# Patient Record
Sex: Male | Born: 1970 | Race: White | Hispanic: No | Marital: Single | Smoking: Current every day smoker
Health system: Southern US, Community
[De-identification: ages and names within clinical notes are randomized; demographics above are authoritative.]

---

## 2012-08-22 ENCOUNTER — Emergency Department (HOSPITAL_BASED_OUTPATIENT_CLINIC_OR_DEPARTMENT_OTHER)
Admission: EM | Admit: 2012-08-22 | Discharge: 2012-08-22 | Disposition: A | Discharge status: Home / Self Care | Payer: No Typology Code available for payment source | Attending: Emergency Medicine | Admitting: Emergency Medicine

## 2012-08-22 ENCOUNTER — Encounter (HOSPITAL_BASED_OUTPATIENT_CLINIC_OR_DEPARTMENT_OTHER): Payer: Self-pay

## 2012-08-22 DIAGNOSIS — IMO0001 Reserved for inherently not codable concepts without codable children: Secondary | ICD-10-CM | POA: Insufficient documentation

## 2012-08-22 DIAGNOSIS — R197 Diarrhea, unspecified: Secondary | ICD-10-CM | POA: Insufficient documentation

## 2012-08-22 DIAGNOSIS — R05 Cough: Secondary | ICD-10-CM | POA: Insufficient documentation

## 2012-08-22 DIAGNOSIS — B9789 Other viral agents as the cause of diseases classified elsewhere: Secondary | ICD-10-CM | POA: Insufficient documentation

## 2012-08-22 DIAGNOSIS — R1110 Vomiting, unspecified: Secondary | ICD-10-CM | POA: Insufficient documentation

## 2012-08-22 DIAGNOSIS — R059 Cough, unspecified: Secondary | ICD-10-CM | POA: Insufficient documentation

## 2012-08-22 DIAGNOSIS — F17200 Nicotine dependence, unspecified, uncomplicated: Secondary | ICD-10-CM | POA: Insufficient documentation

## 2012-08-22 DIAGNOSIS — B349 Viral infection, unspecified: Secondary | ICD-10-CM

## 2012-08-22 MED ORDER — DEXTROMETHORPHAN HBR 15 MG/5ML PO SYRP: 10.0000 mL | ORAL_SOLUTION | Freq: Four times a day (QID) | ORAL | Status: DC | PRN

## 2012-08-22 MED ORDER — OSELTAMIVIR PHOSPHATE 75 MG PO CAPS: 75.0000 mg | ORAL_CAPSULE | Freq: Two times a day (BID) | ORAL | Status: DC

## 2012-08-22 NOTE — ED Provider Notes (Signed)
History     CSN: [id number]  Arrival date & time 08/22/12  1206   First MD Initiated Contact with Patient 08/22/12 1219      Chief Complaint  Patient presents with  . Fever  . Generalized Body Aches  . Cough  . Diarrhea    (Consider location/radiation/quality/duration/timing/severity/associated sxs/prior treatment) HPI Comments: Pt presents today with fever, generalized body aches, headache, cough, and diarrhea x 1 day.  Also reports one episode of vomiting. Symptoms were of sudden onset and have been progressively worsening.  Highest reported fever was 101.  States he was recently in contact with girlfriends son who was diagnosed and treated for the flu. Has taken OTC Ibuprofen for fever and headache with some relief.  Denies any SOB, chest pain, or abdominal pain.  Patient is a 42 y.o. male presenting with fever, cough, and diarrhea.  Fever Primary symptoms of the febrile illness include fever, fatigue, headaches, cough, vomiting, diarrhea and arthralgias.  Cough Associated symptoms include headaches.  Diarrhea The primary symptoms include fever, fatigue, vomiting, diarrhea and arthralgias.    History reviewed. No pertinent past medical history.  History reviewed. No pertinent past surgical history.  No family history on file.  History  Substance Use Topics  . Smoking status: Current Every Day Smoker -- 1.0 packs/day    Types: Cigarettes  . Smokeless tobacco: Not on file  . Alcohol Use: No      Review of Systems  Constitutional: Positive for fever and fatigue.  Respiratory: Positive for cough.   Gastrointestinal: Positive for vomiting and diarrhea.  Musculoskeletal: Positive for arthralgias.  Neurological: Positive for headaches.  All other systems reviewed and are negative.    Allergies  Review of patient's allergies indicates no known allergies.  Home Medications  No current outpatient prescriptions on file.  BP 133/69  Pulse 100  Temp 98.7 F (37.1  C) (Oral)  Resp 18  Ht 5\' 11"  (1.803 m)  Wt 210 lb (95.255 kg)  BMI 29.29 kg/m2  SpO2 100%  Physical Exam  Nursing note and vitals reviewed. Constitutional: He is oriented to person, place, and time. He appears well-developed and well-nourished. No distress.  HENT:  Mouth/Throat: Oropharynx is clear and moist. No oropharyngeal exudate.  Eyes: EOM are normal.  Neck: Normal range of motion.  Cardiovascular: Normal rate, regular rhythm and normal heart sounds.   Pulmonary/Chest: Effort normal and breath sounds normal. No respiratory distress. He has no wheezes. He has no rales. He exhibits no tenderness.  Abdominal: Soft. Bowel sounds are normal. There is no tenderness.  Musculoskeletal: Normal range of motion.  Neurological: He is alert and oriented to person, place, and time.  Skin: Skin is warm and dry. He is not diaphoretic.  Psychiatric: He has a normal mood and affect. His behavior is normal.    ED Course  Procedures (including critical care time)  Labs Reviewed - No data to display No results found.   1. Viral illness       MDM  12:54 PM Patient likely has a viral illness. Patient afebrile and non toxic appearing. Patient will be treated with tamiflu and dextromethorphan. Vitals stable for discharge. Patient instructed to return with worsening or concerning symptoms.       Emilia Beck, PA-C 08/22/12 1604

## 2012-08-22 NOTE — ED Notes (Signed)
Pt reports generalized body aches, fever, cough and diarrhea since yesterday.

## 2012-08-23 NOTE — ED Provider Notes (Signed)
Medical screening examination/treatment/procedure(s) were performed by non-physician practitioner and as supervising physician I was immediately available for consultation/collaboration.  Geoffery Lyons, MD 08/23/12 [phone]

## 2014-03-21 ENCOUNTER — Emergency Department (HOSPITAL_BASED_OUTPATIENT_CLINIC_OR_DEPARTMENT_OTHER): Payer: No Typology Code available for payment source

## 2014-03-21 ENCOUNTER — Encounter (HOSPITAL_BASED_OUTPATIENT_CLINIC_OR_DEPARTMENT_OTHER): Payer: Self-pay | Admitting: Emergency Medicine

## 2014-03-21 ENCOUNTER — Emergency Department (HOSPITAL_BASED_OUTPATIENT_CLINIC_OR_DEPARTMENT_OTHER)
Admission: EM | Admit: 2014-03-21 | Discharge: 2014-03-21 | Discharge status: Against Medical Advice | Payer: No Typology Code available for payment source | Attending: Emergency Medicine | Admitting: Emergency Medicine

## 2014-03-21 DIAGNOSIS — IMO0002 Reserved for concepts with insufficient information to code with codable children: Secondary | ICD-10-CM | POA: Insufficient documentation

## 2014-03-21 DIAGNOSIS — R079 Chest pain, unspecified: Secondary | ICD-10-CM | POA: Insufficient documentation

## 2014-03-21 DIAGNOSIS — X58XXXA Exposure to other specified factors, initial encounter: Secondary | ICD-10-CM | POA: Diagnosis not present

## 2014-03-21 DIAGNOSIS — R0789 Other chest pain: Secondary | ICD-10-CM | POA: Insufficient documentation

## 2014-03-21 DIAGNOSIS — R55 Syncope and collapse: Secondary | ICD-10-CM | POA: Insufficient documentation

## 2014-03-21 DIAGNOSIS — Y9301 Activity, walking, marching and hiking: Secondary | ICD-10-CM | POA: Diagnosis not present

## 2014-03-21 DIAGNOSIS — Y9289 Other specified places as the place of occurrence of the external cause: Secondary | ICD-10-CM | POA: Diagnosis not present

## 2014-03-21 DIAGNOSIS — Z79899 Other long term (current) drug therapy: Secondary | ICD-10-CM | POA: Diagnosis not present

## 2014-03-21 DIAGNOSIS — Z720 Tobacco use: Secondary | ICD-10-CM

## 2014-03-21 DIAGNOSIS — F17200 Nicotine dependence, unspecified, uncomplicated: Secondary | ICD-10-CM | POA: Insufficient documentation

## 2014-03-21 LAB — CBC WITH DIFFERENTIAL/PLATELET
BASOS PCT: 0 % (ref 0–1)
Basophils Absolute: 0.1 10*3/uL (ref 0.0–0.1)
EOS ABS: 0.3 10*3/uL (ref 0.0–0.7)
EOS PCT: 2 % (ref 0–5)
HEMATOCRIT: 44.3 % (ref 39.0–52.0)
HEMOGLOBIN: 15.5 g/dL (ref 13.0–17.0)
Lymphocytes Relative: 17 % (ref 12–46)
Lymphs Abs: 2.6 10*3/uL (ref 0.7–4.0)
MCH: 31.8 pg (ref 26.0–34.0)
MCHC: 35 g/dL (ref 30.0–36.0)
MCV: 90.8 fL (ref 78.0–100.0)
MONO ABS: 0.9 10*3/uL (ref 0.1–1.0)
MONOS PCT: 6 % (ref 3–12)
Neutro Abs: 11 10*3/uL — ABNORMAL HIGH (ref 1.7–7.7)
Neutrophils Relative %: 75 % (ref 43–77)
Platelets: 231 10*3/uL (ref 150–400)
RBC: 4.88 MIL/uL (ref 4.22–5.81)
RDW: 13.9 % (ref 11.5–15.5)
WBC: 14.9 10*3/uL — ABNORMAL HIGH (ref 4.0–10.5)

## 2014-03-21 LAB — BASIC METABOLIC PANEL
Anion gap: 14 (ref 5–15)
BUN: 15 mg/dL (ref 6–23)
CO2: 23 mEq/L (ref 19–32)
CREATININE: 0.9 mg/dL (ref 0.50–1.35)
Calcium: 9.5 mg/dL (ref 8.4–10.5)
Chloride: 104 mEq/L (ref 96–112)
Glucose, Bld: 116 mg/dL — ABNORMAL HIGH (ref 70–99)
Potassium: 3.7 mEq/L (ref 3.7–5.3)
Sodium: 141 mEq/L (ref 137–147)

## 2014-03-21 LAB — D-DIMER, QUANTITATIVE: D-Dimer, Quant: 0.8 ug/mL-FEU — ABNORMAL HIGH (ref 0.00–0.48)

## 2014-03-21 LAB — TROPONIN I: Troponin I: <0.3 ng/mL (ref <0.30)

## 2014-03-21 MED ORDER — IOHEXOL 350 MG/ML SOLN
80.0000 mL | Freq: Once | INTRAVENOUS | Status: AC | PRN
  Administered 2014-03-21: 80 mL via INTRAVENOUS

## 2014-03-21 MED ORDER — ASPIRIN 81 MG PO CHEW
324.0000 mg | CHEWABLE_TABLET | Freq: Once | ORAL | Status: AC
  Administered 2014-03-21: 324 mg via ORAL
  Filled 2014-03-21: qty 4

## 2014-03-21 NOTE — ED Notes (Signed)
Pt states that  pm last night he developed chest pressure with nausea, dizziness, with a near syncopal episode. Pt reports pain has continued.

## 2014-03-21 NOTE — ED Notes (Signed)
MD at bedside. 

## 2014-03-21 NOTE — ED Notes (Signed)
Patient transported to CT 

## 2014-03-21 NOTE — ED Notes (Signed)
Pt left ama, instructed by rn and MD about the events that could occur, pt refused to stay for additional testing and requested to sign out ama

## 2014-03-21 NOTE — ED Provider Notes (Signed)
CSN: [id number]     Arrival date & time 03/21/14  0359 History   First MD Initiated Contact with Patient 03/21/14 0407     Chief Complaint  Patient presents with  . Chest Pain  . Near Syncope     (Consider location/radiation/quality/duration/timing/severity/associated sxs/prior Treatment) HPI 43 yo male presents to the ER from home with complaint of syncope and chest pain.  Pt reports around 10 pm he was leaving a friends house when he began to feel queasy, hot, flushed, dizzy, and lightheaded.  He got out of the car to walk around and blacked out.  He reports he felt himself falling and caught himself with his elbows and knees.  He does not think he went all the way out.  Pt went to bed upon getting home.  Pt woke around 330 am with right sided chest pain, dull in nature.  No sob, diaphoresis.  Pain described as an ache, muscle pull but can't locate the pain.  Pt is a 1 ppd smoker.  No h/o cardiac disease, htn, hyperlipidemia or dm, but does not see a doctor regularly.  No family history of CAD. History reviewed. No pertinent past medical history. History reviewed. No pertinent past surgical history. History reviewed. No pertinent family history. History  Substance Use Topics  . Smoking status: Current Every Day Smoker -- 1.00 packs/day    Types: Cigarettes  . Smokeless tobacco: Not on file  . Alcohol Use: No    Review of Systems  See History of Present Illness; otherwise all other systems are reviewed and negative  Allergies  Review of patient's allergies indicates no known allergies.  Home Medications   Prior to Admission medications   Medication Sig Start Date End Date Taking? Authorizing Provider  dextromethorphan 15 MG/5ML syrup Take 10 mLs (30 mg total) by mouth 4 (four) times daily as needed for cough. 08/22/12   Emilia Beck, PA-C  oseltamivir (TAMIFLU) 75 MG capsule Take 1 capsule (75 mg total) by mouth every 12 (twelve) hours. 08/22/12   Kaitlyn Szekalski, PA-C   BP  137/78  Pulse 82  Temp(Src) 98.3 F (36.8 C) (Oral)  Resp 20  Wt 225 lb (102.059 kg)  SpO2 98% Physical Exam  Nursing note and vitals reviewed. Constitutional: He is oriented to person, place, and time. He appears well-developed and well-nourished.  HENT:  Head: Normocephalic and atraumatic.  Nose: Nose normal.  Mouth/Throat: Oropharynx is clear and moist.  Eyes: Conjunctivae and EOM are normal. Pupils are equal, round, and reactive to light.  Neck: Normal range of motion. Neck supple. No JVD present. No tracheal deviation present. No thyromegaly present.  Cardiovascular: Normal rate, regular rhythm, normal heart sounds and intact distal pulses.  Exam reveals no gallop and no friction rub.   No murmur heard. Pulmonary/Chest: Effort normal and breath sounds normal. No stridor. No respiratory distress. He has no wheezes. He has no rales. He exhibits tenderness (patient is tenderness palpation to right anterior chest whichseems to reproduce pain somewhat).  Abdominal: Soft. Bowel sounds are normal. He exhibits no distension and no mass. There is no tenderness. There is no rebound and no guarding.  Musculoskeletal: Normal range of motion. He exhibits no edema and no tenderness.  Abrasion to right elbow  Lymphadenopathy:    He has no cervical adenopathy.  Neurological: He is alert and oriented to person, place, and time. He exhibits normal muscle tone. Coordination normal.  Skin: Skin is warm and dry. No rash noted. No erythema. No  pallor.  Psychiatric: He has a normal mood and affect. His behavior is normal. Judgment and thought content normal.    ED Course  Procedures (including critical care time) Labs Review Labs Reviewed  CBC WITH DIFFERENTIAL - Abnormal; Notable for the following:    WBC 14.9 (*)    Neutro Abs 11.0 (*)    All other components within normal limits  BASIC METABOLIC PANEL - Abnormal; Notable for the following:    Glucose, Bld 116 (*)    All other components  within normal limits  D-DIMER, QUANTITATIVE - Abnormal; Notable for the following:    D-Dimer, Quant 0.80 (*)    All other components within normal limits  TROPONIN I    Imaging Review Dg Chest 2 View  03/21/2014   CLINICAL DATA:  Chest pain and pressure.  Dizziness.  Smoker.  EXAM: CHEST  2 VIEW  COMPARISON:  None.  FINDINGS: Normal heart size and pulmonary vascularity. Peribronchial thickening and central interstitial changes suggesting chronic bronchitis. No focal airspace disease or consolidation in the lungs. No blunting of costophrenic angles. No pneumothorax.  IMPRESSION: Chronic bronchitic changes in the lungs. No evidence of active disease.   Electronically Signed   By: Burman Nieves M.D.   On: 03/21/2014 04:34   Ct Angio Chest Pe W/cm &/or Wo Cm  03/21/2014   CLINICAL DATA:  Chest pressure with nausea, dizziness, near syncope. Elevated D-dimer.  EXAM: CT ANGIOGRAPHY CHEST WITH CONTRAST  TECHNIQUE: Multidetector CT imaging of the chest was performed using the standard protocol during bolus administration of intravenous contrast. Multiplanar CT image reconstructions and MIPs were obtained to evaluate the vascular anatomy.  CONTRAST:  80mL OMNIPAQUE IOHEXOL 350 MG/ML SOLN  COMPARISON:  Prior radiograph performed earlier on the same day.  FINDINGS: Thyroid gland within normal limits.  There is a mildly enlarged 1.3 cm right hilar lymph node (series 5, image 13). No other pathologically enlarged mediastinal, hilar, or axillary lymph nodes are identified.  Intrathoracic aorta is of normal caliber and appearance. Great vessels within normal limits.  Heart size is normal.  No pericardial effusion.  Pulmonary arterial tree is well opacified. No filling defect to suggest acute pulmonary embolism. Re-formatted imaging confirms these findings.  The lungs are clear without focal infiltrate or pulmonary edema. No pleural effusion. No pneumothorax. Mild air trapping seen at the lung bases. Single 3 mm  subpleural nodule seen within the left upper lobe (series 6, image 21). No other pulmonary nodule or mass.  Visualized portions of the upper abdomen are within normal limits.  No acute osseous abnormality. No worrisome lytic or blastic osseous lesions.  Review of the MIP images confirms the above findings.  IMPRESSION: 1. No CT evidence of acute pulmonary embolism. 2. No other acute cardiopulmonary process identified. 3. Mildly prominent 1.3 cm right hilar lymph node, of uncertain clinical significance. 4. 3 mm left upper lobe pulmonary nodule. If the patient is at high risk for bronchogenic carcinoma, follow-up chest CT at 1 year is recommended. If the patient is at low risk, no follow-up is needed. This recommendation follows the consensus statement: Guidelines for Management of Small Pulmonary Nodules Detected on CT Scans: A Statement from the Fleischner Society as published in Radiology 2005; 237:395-400.   Electronically Signed   By: Rise Mu M.D.   On: 03/21/2014 05:39     EKG Interpretation   Date/Time:  Friday March 21 2014 04:07:14 EDT Ventricular Rate:  80 PR Interval:  156 QRS Duration: 84 QT  Interval:  388 QTC Calculation: 447 R Axis:   37 Text Interpretation:  Normal sinus rhythm Normal ECG No old tracing to  compare Confirmed by Nasim Garofano  MD, Darren Caldron ([id number]) on 03/21/2014 4:36:23 AM      MDM   Final diagnoses:  Chest pain, unspecified chest pain type  Syncope, unspecified syncope type  Tobacco abuse   43 year old male with near syncope presyncope or this evening which sound somewhat like a vasovagal reaction now with chest pain.  EKG normal sinus rhythm.  D-dimer is elevated, discuss with patient need for CT Angio Chest.  Will also get delta troponin, and if negative will discharge home.  Patient has been strongly advised to work on cutting back or quitting his smoking.   6:03 AM Pt does not wish to stay for delta troponin.  Will let sign out AMA.  Pt has been advised  of his workup thus far, and need for f/u in 1 year for nodule noted on CT chest.  Olivia Mackie, MD 03/21/14 [phone]

## 2014-08-15 ENCOUNTER — Ambulatory Visit: Payer: Self-pay | Admitting: Medical

## 2014-12-30 ENCOUNTER — Encounter (HOSPITAL_BASED_OUTPATIENT_CLINIC_OR_DEPARTMENT_OTHER): Payer: Self-pay | Admitting: *Deleted

## 2014-12-30 ENCOUNTER — Emergency Department (HOSPITAL_BASED_OUTPATIENT_CLINIC_OR_DEPARTMENT_OTHER)
Admission: EM | Admit: 2014-12-30 | Discharge: 2014-12-30 | Disposition: A | Discharge status: Home / Self Care | Payer: 59 | Attending: Emergency Medicine | Admitting: Emergency Medicine

## 2014-12-30 ENCOUNTER — Emergency Department (HOSPITAL_BASED_OUTPATIENT_CLINIC_OR_DEPARTMENT_OTHER): Payer: 59

## 2014-12-30 DIAGNOSIS — M25522 Pain in left elbow: Secondary | ICD-10-CM | POA: Diagnosis present

## 2014-12-30 DIAGNOSIS — G5632 Lesion of radial nerve, left upper limb: Secondary | ICD-10-CM

## 2014-12-30 DIAGNOSIS — Z720 Tobacco use: Secondary | ICD-10-CM | POA: Insufficient documentation

## 2014-12-30 NOTE — Discharge Instructions (Signed)

## 2014-12-30 NOTE — ED Notes (Signed)
Left elbow pain after carrying water.

## 2014-12-30 NOTE — ED Provider Notes (Addendum)
CSN: [id number]     Arrival date & time 12/30/14  1527 History   First MD Initiated Contact with Patient 12/30/14 1534     Chief Complaint  Patient presents with  . Arm Pain     (Consider location/radiation/quality/duration/timing/severity/associated sxs/prior Treatment) Patient is a 44 y.o. male presenting with arm pain. The history is provided by the patient.  Arm Pain This is a new (carrying 10-15lb case of water when elbow started hurting) problem. The current episode started less than 1 hour ago. The problem occurs constantly. The problem has not changed since onset.Associated symptoms comments: Pain in the left elbow worse with moving of the forearm. The symptoms are aggravated by bending and twisting. The symptoms are relieved by rest. He has tried nothing for the symptoms. The treatment provided no relief.    History reviewed. No pertinent past medical history. History reviewed. No pertinent past surgical history. No family history on file. History  Substance Use Topics  . Smoking status: Current Every Day Smoker -- 1.00 packs/day    Types: Cigarettes  . Smokeless tobacco: Not on file  . Alcohol Use: No    Review of Systems  All other systems reviewed and are negative.     Allergies  Review of patient's allergies indicates no known allergies.  Home Medications   Prior to Admission medications   Medication Sig Start Date End Date Taking? Authorizing Provider  dextromethorphan 15 MG/5ML syrup Take 10 mLs (30 mg total) by mouth 4 (four) times daily as needed for cough. 08/22/12   Emilia Beck, PA-C  oseltamivir (TAMIFLU) 75 MG capsule Take 1 capsule (75 mg total) by mouth every 12 (twelve) hours. 08/22/12   Kaitlyn Szekalski, PA-C   BP 139/82 mmHg  Pulse 74  Temp(Src) 98.4 F (36.9 C) (Oral)  Resp 16  Ht  (1.803 m)  Wt 210 lb (95.255 kg)  BMI 29.30 kg/m2  SpO2 100% Physical Exam  Constitutional: He is oriented to person, place, and time. He appears  well-developed and well-nourished. No distress.  HENT:  Head: Normocephalic and atraumatic.  Cardiovascular: Normal rate.   Pulmonary/Chest: Effort normal.  Musculoskeletal:       Left elbow: He exhibits normal range of motion, no swelling, no deformity and no laceration. Tenderness found. Radial head tenderness noted.  Tenderness over the left radial head and over the radial groove.  Reproduction of sx with hand/forearm supination.  Neurological: He is alert and oriented to person, place, and time.  Nursing note and vitals reviewed.   ED Course  Procedures (including critical care time) Labs Review Labs Reviewed - No data to display  Imaging Review Dg Elbow Complete Left  12/30/2014   CLINICAL DATA:  Left elbow pain, burning after carrying a case of water this afternoon.  EXAM: LEFT ELBOW - COMPLETE 3+ VIEW  COMPARISON:  None.  FINDINGS: There is no evidence of fracture, dislocation, or joint effusion. There is no evidence of arthropathy or other focal bone abnormality. Soft tissues are unremarkable.  IMPRESSION: Negative.   Electronically Signed   By: Charlett Nose M.D.   On: 12/30/2014 15:53     EKG Interpretation None      MDM   Final diagnoses:  Radial nerve irritation, left    Patient presenting with left elbow pain after carrying 10-15 pound container of water. Patient denies any popping sensation and no fall or trauma to the elbow. On exam he has tenderness over his radial head with reproduction of the pain  with hand supination. Normal strength and sensation.  X-rays within normal limits and feel patient has radial nerve irritation from repetitive motions.    Gwyneth SproutWhitney Shaiann Mcmanamon, MD 12/30/14 1559  Gwyneth SproutWhitney Amilee Janvier, MD 12/30/14 1600

## 2015-01-30 ENCOUNTER — Emergency Department (HOSPITAL_BASED_OUTPATIENT_CLINIC_OR_DEPARTMENT_OTHER)
Admission: EM | Admit: 2015-01-30 | Discharge: 2015-01-30 | Disposition: A | Discharge status: Home / Self Care | Payer: 59 | Attending: Emergency Medicine | Admitting: Emergency Medicine

## 2015-01-30 ENCOUNTER — Encounter (HOSPITAL_BASED_OUTPATIENT_CLINIC_OR_DEPARTMENT_OTHER): Payer: Self-pay

## 2015-01-30 DIAGNOSIS — R05 Cough: Secondary | ICD-10-CM

## 2015-01-30 DIAGNOSIS — R062 Wheezing: Secondary | ICD-10-CM | POA: Diagnosis not present

## 2015-01-30 DIAGNOSIS — R200 Anesthesia of skin: Secondary | ICD-10-CM | POA: Diagnosis not present

## 2015-01-30 DIAGNOSIS — Z720 Tobacco use: Secondary | ICD-10-CM | POA: Insufficient documentation

## 2015-01-30 DIAGNOSIS — R059 Cough, unspecified: Secondary | ICD-10-CM

## 2015-01-30 DIAGNOSIS — R202 Paresthesia of skin: Secondary | ICD-10-CM | POA: Insufficient documentation

## 2015-01-30 MED ORDER — ALBUTEROL SULFATE HFA 108 (90 BASE) MCG/ACT IN AERS: 1.0000 | INHALATION_SPRAY | Freq: Four times a day (QID) | RESPIRATORY_TRACT | Status: AC | PRN

## 2015-01-30 MED ORDER — NAPROXEN 500 MG PO TABS: 500.0000 mg | ORAL_TABLET | Freq: Two times a day (BID) | ORAL | Status: AC

## 2015-01-30 NOTE — ED Notes (Signed)
C/o wheezing, prod cough started yesterday-numbness to right hand x "couple months"-NAD

## 2015-01-30 NOTE — Discharge Instructions (Signed)
Take the prescribed medication as directed. Follow-up with Dr. Pearletha ForgeHudnall-- office upstairs. Return to the ED for new or worsening symptoms.

## 2015-01-30 NOTE — ED Provider Notes (Signed)
CSN: [id number]     Arrival date & time 01/30/15  1155 History   First MD Initiated Contact with Patient 01/30/15 1247     Chief Complaint  Patient presents with  . Wheezing     (Consider location/radiation/quality/duration/timing/severity/associated sxs/prior Treatment) Patient is a 44 y.o. male presenting with wheezing. The history is provided by the patient and medical records.  Wheezing Associated symptoms: cough     This is a 44 year old male with no significant past medical history presenting to the ED for productive cough and wheezing which began yesterday. Patient denies any fever or chills. No known sick contacts. He has no underlying history of asthma or COPD. He is a daily smoker. He also reports he is around chemicals and dust at work. Symptoms do seem worse in the morning. No intervention has been tried prior to arrival. Patient also notes intermittent numbness to right hand for the past couple months. Patient works at a [employer] and does repetitive work with his right hand. He states at times his hand feels "asleep". This happens throughout the night as well which sometimes awakens him from sleep. He denies any numbness or weakness of right hand. He has no prior history of right hand injuries. Patient is right-hand dominant.  History reviewed. No pertinent past medical history. History reviewed. No pertinent past surgical history. No family history on file. History  Substance Use Topics  . Smoking status: Current Every Day Smoker -- 1.00 packs/day    Types: Cigarettes  . Smokeless tobacco: Not on file  . Alcohol Use: No    Review of Systems  Respiratory: Positive for cough and wheezing.   Neurological: Positive for numbness (paresthesias).  All other systems reviewed and are negative.     Allergies  Review of patient's allergies indicates no known allergies.  Home Medications   Prior to Admission medications   Not on File   BP 135/82 mmHg  Pulse 91   Temp(Src) 98.3 F (36.8 C) (Oral)  Resp 16  Ht  (1.803 m)  Wt 205 lb (92.987 kg)  BMI 28.60 kg/m2  SpO2 100%   Physical Exam  Constitutional: He is oriented to person, place, and time. He appears well-developed and well-nourished. No distress.  HENT:  Head: Normocephalic and atraumatic.  Mouth/Throat: Oropharynx is clear and moist.  Eyes: Conjunctivae and EOM are normal. Pupils are equal, round, and reactive to light.  Neck: Normal range of motion. Neck supple.  Cardiovascular: Normal rate, regular rhythm and normal heart sounds.   Pulmonary/Chest: Effort normal and breath sounds normal. No respiratory distress. He has no wheezes.  Respirations unlabored, lungs clear bilaterally, O2 sats 100% on room air  Musculoskeletal: Normal range of motion.       Right hand: Normal. He exhibits normal range of motion, no tenderness, no bony tenderness, normal two-point discrimination, normal capillary refill, no deformity, no laceration and no swelling. Normal sensation noted. Normal strength noted.  Right hand grossly normal in appearance without noted swelling or bony deformity; full range of motion of wrist and all fingers; normal sensation throughout hand, strong radial pulse and cap refill; negative Tinel's and Phalen sign  Neurological: He is alert and oriented to person, place, and time.  Skin: Skin is warm and dry. He is not diaphoretic.  Psychiatric: He has a normal mood and affect.  Nursing note and vitals reviewed.   ED Course  Procedures (including critical care time) Labs Review Labs Reviewed - No data to display  Imaging  Review No results found.   EKG Interpretation None      MDM   Final diagnoses:  Cough  Paresthesias in right hand   44 year old male here with productive cough and wheezing since yesterday. No fevers or chills. No sick contacts. He is exposed to dust and particles at work.  No chest pain or SOB.  Lungs clear bilaterally without wheezes or rhonchi  to suggest CAP.  Doubt ACS, PE.  Suspect symptoms due to airway irritation from dust at work.  Will start on albuterol inhaler as needed.  Patient also with intermittent paresthesias of right hand for the past several months. No known injuries, trauma, or falls. Exam is grossly normal, neurologically intact. Negative Tinel's and Phalen's sign.  Possibly tendonitis from overuse as he does repetitive motions on a daily basis.  Rx naprosyn, ace wrap applied here for comfort.  Recommended close outpatient follow-up, referral given for Dr. Pearletha ForgeHudnall.  Discussed plan with patient, he/she acknowledged understanding and agreed with plan of care.  Return precautions given for new or worsening symptoms.  Garlon HatchetLisa M Pricella Gaugh, PA-C 01/30/15 1510  Vanetta MuldersScott Zackowski, MD 01/31/15 [phone]

## 2015-02-06 ENCOUNTER — Encounter (HOSPITAL_BASED_OUTPATIENT_CLINIC_OR_DEPARTMENT_OTHER): Payer: Self-pay | Admitting: *Deleted

## 2015-02-06 ENCOUNTER — Emergency Department (HOSPITAL_BASED_OUTPATIENT_CLINIC_OR_DEPARTMENT_OTHER)
Admission: EM | Admit: 2015-02-06 | Discharge: 2015-02-06 | Disposition: A | Discharge status: Home / Self Care | Payer: 59 | Attending: Emergency Medicine | Admitting: Emergency Medicine

## 2015-02-06 DIAGNOSIS — Z791 Long term (current) use of non-steroidal anti-inflammatories (NSAID): Secondary | ICD-10-CM | POA: Diagnosis not present

## 2015-02-06 DIAGNOSIS — Z79899 Other long term (current) drug therapy: Secondary | ICD-10-CM | POA: Insufficient documentation

## 2015-02-06 DIAGNOSIS — R2242 Localized swelling, mass and lump, left lower limb: Secondary | ICD-10-CM | POA: Diagnosis present

## 2015-02-06 DIAGNOSIS — Z720 Tobacco use: Secondary | ICD-10-CM | POA: Diagnosis not present

## 2015-02-06 DIAGNOSIS — M25462 Effusion, left knee: Secondary | ICD-10-CM | POA: Insufficient documentation

## 2015-02-06 NOTE — Discharge Instructions (Signed)

## 2015-02-06 NOTE — ED Provider Notes (Addendum)
CSN: [id number]     Arrival date & time 02/06/15  1642 History   First MD Initiated Contact with Patient 02/06/15 1740     Chief Complaint  Patient presents with  . Joint Swelling     (Consider location/radiation/quality/duration/timing/severity/associated sxs/prior Treatment) Patient is a 44 y.o. male presenting with knee pain.  Knee Pain Location:  Knee Time since incident:  1 day (this AM) Injury: no   Knee location:  L knee Pain details:    Quality: feels tight, painful with ambulation (only when bearing weight)   Radiates to:  Does not radiate   Severity:  Moderate   Onset quality:  Gradual   Timing:  Constant   Progression:  Unchanged Chronicity:  New Dislocation: no   Tetanus status:  Up to date Prior injury to area:  Yes Ineffective treatments:  NSAIDs ((don't help with swelling)) Associated symptoms: swelling   Associated symptoms: no back pain, no decreased ROM, no fever, no muscle weakness, no numbness and no tingling   Risk factors comment:  No hx of IVDU/HIV/immunocompromise/no dysuria nor concern for gc/chl, no known tick bites.  has had other areas of joint swelling before but attributed to working with hands   History reviewed. No pertinent past medical history. History reviewed. No pertinent past surgical history. No family history on file. History  Substance Use Topics  . Smoking status: Current Every Day Smoker -- 1.00 packs/day    Types: Cigarettes  . Smokeless tobacco: Not on file  . Alcohol Use: No    Review of Systems  Constitutional: Negative for fever.  HENT: Negative for sore throat.   Eyes: Negative for visual disturbance.  Respiratory: Negative for shortness of breath.   Cardiovascular: Negative for chest pain.  Gastrointestinal: Negative for nausea, vomiting, abdominal pain, diarrhea and constipation.  Genitourinary: Negative for dysuria and difficulty urinating.  Musculoskeletal: Positive for joint swelling and arthralgias. Negative  for back pain and neck stiffness.  Skin: Negative for rash.  Neurological: Negative for syncope and headaches.      Allergies  Review of patient's allergies indicates no known allergies.  Home Medications   Prior to Admission medications   Medication Sig Start Date End Date Taking? Authorizing Provider  albuterol (PROVENTIL HFA;VENTOLIN HFA) 108 (90 BASE) MCG/ACT inhaler Inhale 1-2 puffs into the lungs every 6 (six) hours as needed for wheezing. 01/30/15   Garlon Hatchet, PA-C  naproxen (NAPROSYN) 500 MG tablet Take 1 tablet (500 mg total) by mouth 2 (two) times daily with a meal. 01/30/15   Garlon Hatchet, PA-C   BP 130/72 mmHg  Pulse 63  Temp(Src) 98.1 F (36.7 C) (Oral)  Resp 18  Ht  (1.803 m)  Wt 205 lb (92.987 kg)  BMI 28.60 kg/m2  SpO2 99% Physical Exam  Constitutional: He is oriented to person, place, and time. He appears well-developed and well-nourished. No distress.  HENT:  Head: Normocephalic and atraumatic.  Eyes: Conjunctivae and EOM are normal.  Neck: Normal range of motion.  Cardiovascular: Normal rate, regular rhythm, normal heart sounds and intact distal pulses.  Exam reveals no gallop and no friction rub.   No murmur heard. Pulmonary/Chest: Effort normal and breath sounds normal. No respiratory distress. He has no wheezes. He has no rales.  Abdominal: Soft. He exhibits no distension. There is no tenderness. There is no guarding.  Musculoskeletal: He exhibits no edema.       Right knee: He exhibits normal range of motion, no swelling and no  effusion.       Left knee: He exhibits swelling and effusion. He exhibits normal range of motion, no ecchymosis, no deformity and no erythema. No tenderness found.  Neurological: He is alert and oriented to person, place, and time.  Skin: Skin is warm and dry. He is not diaphoretic.  Nursing note and vitals reviewed.   ED Course  Procedures (including critical care time) Labs Review Labs Reviewed - No data to  display  Imaging Review No results found.   EKG Interpretation None      MDM   Final diagnoses:  Knee swelling, left, left knee effusion   44yo male with no significant medical history presents with concern of left knee swelling, pain only present with ambulation.  Patient without erythema, no fever, full range of motion of joint and have low suspicion for septic arthritis.  No history of trauma to suggest fracture or acute ligamentous injury. DDx for swelling in left knee includes arthritis, other inflammatory arthritis.  Recommended PCP follow up. Patient discharged in stable condition with understanding of reasons to return.       Alvira MondayErin Richardine Peppers, MD 02/07/15 1356

## 2015-02-06 NOTE — ED Notes (Signed)
Pt states he awoke this am with Left Knee swelling and pain, denies any injury/trauma to site

## 2015-02-06 NOTE — ED Notes (Signed)
Woke with swelling in his left knee. No known injury.

## 2015-02-06 NOTE — ED Notes (Signed)
Denies any pain in lower portion of leg

## 2015-03-01 ENCOUNTER — Emergency Department (HOSPITAL_BASED_OUTPATIENT_CLINIC_OR_DEPARTMENT_OTHER)
Admission: EM | Admit: 2015-03-01 | Discharge: 2015-03-01 | Disposition: A | Discharge status: Home / Self Care | Payer: 59 | Attending: Emergency Medicine | Admitting: Emergency Medicine

## 2015-03-01 ENCOUNTER — Encounter (HOSPITAL_BASED_OUTPATIENT_CLINIC_OR_DEPARTMENT_OTHER): Payer: Self-pay | Admitting: *Deleted

## 2015-03-01 DIAGNOSIS — Z791 Long term (current) use of non-steroidal anti-inflammatories (NSAID): Secondary | ICD-10-CM | POA: Insufficient documentation

## 2015-03-01 DIAGNOSIS — M6281 Muscle weakness (generalized): Secondary | ICD-10-CM | POA: Insufficient documentation

## 2015-03-01 DIAGNOSIS — Z79899 Other long term (current) drug therapy: Secondary | ICD-10-CM | POA: Insufficient documentation

## 2015-03-01 DIAGNOSIS — K529 Noninfective gastroenteritis and colitis, unspecified: Secondary | ICD-10-CM | POA: Insufficient documentation

## 2015-03-01 DIAGNOSIS — Z720 Tobacco use: Secondary | ICD-10-CM | POA: Insufficient documentation

## 2015-03-01 LAB — CBC
HCT: 52.6 % — ABNORMAL HIGH (ref 39.0–52.0)
HEMOGLOBIN: 18.1 g/dL — AB (ref 13.0–17.0)
MCH: 30.6 pg (ref 26.0–34.0)
MCHC: 34.4 g/dL (ref 30.0–36.0)
MCV: 89 fL (ref 78.0–100.0)
PLATELETS: 215 10*3/uL (ref 150–400)
RBC: 5.91 MIL/uL — AB (ref 4.22–5.81)
RDW: 14.9 % (ref 11.5–15.5)
WBC: 11.8 10*3/uL — AB (ref 4.0–10.5)

## 2015-03-01 LAB — COMPREHENSIVE METABOLIC PANEL
ALT: 15 U/L — AB (ref 17–63)
ANION GAP: 12 (ref 5–15)
AST: 14 U/L — AB (ref 15–41)
Albumin: 4.4 g/dL (ref 3.5–5.0)
Alkaline Phosphatase: 85 U/L (ref 38–126)
BILIRUBIN TOTAL: 0.6 mg/dL (ref 0.3–1.2)
BUN: 23 mg/dL — AB (ref 6–20)
CO2: 31 mmol/L (ref 22–32)
CREATININE: 1.19 mg/dL (ref 0.61–1.24)
Calcium: 10 mg/dL (ref 8.9–10.3)
Chloride: 96 mmol/L — ABNORMAL LOW (ref 101–111)
GFR calc Af Amer: >60 mL/min (ref >60)
GFR calc non Af Amer: >60 mL/min (ref >60)
Glucose, Bld: 136 mg/dL — ABNORMAL HIGH (ref 65–99)
Potassium: 3.6 mmol/L (ref 3.5–5.1)
SODIUM: 139 mmol/L (ref 135–145)
Total Protein: 8.4 g/dL — ABNORMAL HIGH (ref 6.5–8.1)

## 2015-03-01 LAB — LIPASE, BLOOD: LIPASE: 20 U/L — AB (ref 22–51)

## 2015-03-01 MED ORDER — SODIUM CHLORIDE 0.9 % IV BOLUS (SEPSIS)
2000.0000 mL | Freq: Once | INTRAVENOUS | Status: AC
  Administered 2015-03-01: 2000 mL via INTRAVENOUS

## 2015-03-01 MED ORDER — ONDANSETRON HCL 4 MG/2ML IJ SOLN
4.0000 mg | Freq: Once | INTRAMUSCULAR | Status: AC
  Administered 2015-03-01: 4 mg via INTRAVENOUS
  Filled 2015-03-01: qty 2

## 2015-03-01 MED ORDER — ONDANSETRON HCL 8 MG PO TABS: 8.0000 mg | ORAL_TABLET | Freq: Three times a day (TID) | ORAL | Status: AC | PRN

## 2015-03-01 NOTE — ED Notes (Signed)
Patient c/o n/v/d since yesterday, no medications taken

## 2015-03-01 NOTE — Discharge Instructions (Signed)
Dehydration, Adult  Take the medication prescribed as directed for nausea and vomiting. Take Imodium as directed for diarrhea. Avoid milk or foods containing milk such as cheese or ice cream all having diarrhea. Make sure that you drink at least SIX 8 ounce glasses of water or Gatorade daily in order to stay hydrated. Call the [facility]. Get a primary care physician, and to be seen if not improving in 3 or 4 days. Return if you feel worse for any reason. Dehydration is when you lose more fluids from the body than you take in. Vital organs like the kidneys, brain, and heart cannot function without a proper amount of fluids and salt. Any loss of fluids from the body can cause dehydration.  CAUSES   Vomiting.  Diarrhea.  Excessive sweating.  Excessive urine output.  Fever. SYMPTOMS  Mild dehydration  Thirst.  Dry lips.  Slightly dry mouth. Moderate dehydration  Very dry mouth.  Sunken eyes.  Skin does not bounce back quickly when lightly pinched and released.  Dark urine and decreased urine production.  Decreased tear production.  Headache. Severe dehydration  Very dry mouth.  Extreme thirst.  Rapid, weak pulse (more than 100 beats per minute at rest).  Cold hands and feet.  Not able to sweat in spite of heat and temperature.  Rapid breathing.  Blue lips.  Confusion and lethargy.  Difficulty being awakened.  Minimal urine production.  No tears. DIAGNOSIS  Your caregiver will diagnose dehydration based on your symptoms and your exam. Blood and urine tests will help confirm the diagnosis. The diagnostic evaluation should also identify the cause of dehydration. TREATMENT  Treatment of mild or moderate dehydration can often be done at home by increasing the amount of fluids that you drink. It is best to drink small amounts of fluid more often. Drinking too much at one time can make vomiting worse. Refer to the home care  instructions below. Severe dehydration needs to be treated at the hospital where you will probably be given intravenous (IV) fluids that contain water and electrolytes. HOME CARE INSTRUCTIONS   Ask your caregiver about specific rehydration instructions.  Drink enough fluids to keep your urine clear or pale yellow.  Drink small amounts frequently if you have nausea and vomiting.  Eat as you normally do.  Avoid:  Foods or drinks high in sugar.  Carbonated drinks.  Juice.  Extremely hot or cold fluids.  Drinks with caffeine.  Fatty, greasy foods.  Alcohol.  Tobacco.  Overeating.  Gelatin desserts.  Wash your hands well to avoid spreading bacteria and viruses.  Only take over-the-counter or prescription medicines for pain, discomfort, or fever as directed by your caregiver.  Ask your caregiver if you should continue all prescribed and over-the-counter medicines.  Keep all follow-up appointments with your caregiver. SEEK MEDICAL CARE IF:  You have abdominal pain and it increases or stays in one area (localizes).  You have a rash, stiff neck, or severe headache.  You are irritable, sleepy, or difficult to awaken.  You are weak, dizzy, or extremely thirsty. SEEK IMMEDIATE MEDICAL CARE IF:   You are unable to keep fluids down or you get worse despite treatment.  You have frequent episodes of vomiting or diarrhea.  You have blood or green matter (bile) in your vomit.  You have blood in your stool or your stool looks black and tarry.  You have not urinated in 6 to 8 hours, or you have only urinated  a small amount of very dark urine.  You have a fever.  You faint. MAKE SURE YOU:   Understand these instructions.  Will watch your condition.  Will get help right away if you are not doing well or get worse. Document Released: 07/11/2005 Document Revised: 10/03/2011 Document Reviewed: 02/28/2011 [facility] Patient Information 2015 [place], [place]. This  information is not intended to replace advice given to you by your health care provider. Make sure you discuss any questions you have with your health care provider.

## 2015-03-01 NOTE — ED Provider Notes (Signed)
CSN: [id number]     Arrival date & time 03/01/15  1322 History   First MD Initiated Contact with Patient 03/01/15 1400     Chief Complaint  Patient presents with  . Diarrhea   History of present illness: Complains of nausea vomiting and diarrhea onset yesterday. Has vomited approximately 10 times and has had approximately 10 episodes of diarrhea. Symptoms accompanied by crampy diffuse abdominal pain which is intermittent and lasts Presley 15 minutes at a time. Also complains of generalized weakness and feels dehydrated. Nothing makes symptoms better or worse. No treatment prior to coming here. No fever. No other associated symptoms.  (Consider location/radiation/quality/duration/timing/severity/associated sxs/prior Treatment) Patient is a 44 y.o. male presenting with diarrhea.  Diarrhea Associated symptoms: abdominal pain and vomiting     History reviewed. No pertinent past medical history. past medical history negative History reviewed. No pertinent past surgical history. surgical history negative History reviewed. No pertinent family history. History  Substance Use Topics  . Smoking status: Current Every Day Smoker -- 1.00 packs/day    Types: Cigarettes  . Smokeless tobacco: Not on file  . Alcohol Use: No   occasional alcohol use no illicit drug use  Review of Systems  HENT: Negative.   Respiratory: Negative.   Cardiovascular: Negative.   Gastrointestinal: Positive for nausea, vomiting, abdominal pain and diarrhea.  Musculoskeletal: Negative.   Skin: Negative.   Neurological: Positive for weakness.       Generalized weakness  Psychiatric/Behavioral: Negative.   All other systems reviewed and are negative.     Allergies  Review of patient's allergies indicates no known allergies.  Home Medications   Prior to Admission medications   Medication Sig Start Date End Date Taking? Authorizing Provider  albuterol (PROVENTIL HFA;VENTOLIN HFA) 108 (90 BASE) MCG/ACT inhaler  Inhale 1-2 puffs into the lungs every 6 (six) hours as needed for wheezing. 01/30/15   Garlon Hatchet, PA-C  naproxen (NAPROSYN) 500 MG tablet Take 1 tablet (500 mg total) by mouth 2 (two) times daily with a meal. 01/30/15   Garlon Hatchet, PA-C   BP 133/88 mmHg  Pulse 98  Resp 19  SpO2 99% Physical Exam  Constitutional: He appears well-developed and well-nourished. No distress.  HENT:  Head: Normocephalic and atraumatic.  Mucous membranes dry  Eyes: Conjunctivae are normal. Pupils are equal, round, and reactive to light.  Neck: Neck supple. No tracheal deviation present. No thyromegaly present.  Cardiovascular: Normal rate and regular rhythm.   No murmur heard. Pulmonary/Chest: Effort normal and breath sounds normal.  Abdominal: Soft. Bowel sounds are normal. He exhibits no distension. There is no tenderness.  Genitourinary: Penis normal.  Normal male genitalia  Musculoskeletal: Normal range of motion. He exhibits no edema or tenderness.  Neurological: He is alert. Coordination normal.  Skin: Skin is warm and dry. No rash noted.  Psychiatric: He has a normal mood and affect.  Nursing note and vitals reviewed.   ED Course  Procedures (including critical care time) Labs Review Labs Reviewed  LIPASE, BLOOD  COMPREHENSIVE METABOLIC PANEL  CBC    Imaging Review No results found.   EKG Interpretation None     4:40 PM feels much improved and ready to go home after treatment with intravenous fluids. He is able to drink without difficulty. Results for orders placed or performed during the hospital encounter of 03/01/15  Lipase, blood  Result Value Ref Range   Lipase 20 (L) 22 - 51 U/L  Comprehensive metabolic panel  Result Value  Ref Range   Sodium 139 135 - 145 mmol/L   Potassium 3.6 3.5 - 5.1 mmol/L   Chloride 96 (L) 101 - 111 mmol/L   CO2 31 22 - 32 mmol/L   Glucose, Bld 136 (H) 65 - 99 mg/dL   BUN 23 (H) 6 - 20 mg/dL   Creatinine, Ser 4.09 0.61 - 1.24 mg/dL   Calcium  81.1 8.9 - 91.4 mg/dL   Total Protein 8.4 (H) 6.5 - 8.1 g/dL   Albumin 4.4 3.5 - 5.0 g/dL   AST 14 (L) 15 - 41 U/L   ALT 15 (L) 17 - 63 U/L   Alkaline Phosphatase 85 38 - 126 U/L   Total Bilirubin 0.6 0.3 - 1.2 mg/dL   GFR calc non Af Amer >60 >60 mL/min   GFR calc Af Amer >60 >60 mL/min   Anion gap 12 5 - 15  CBC  Result Value Ref Range   WBC 11.8 (H) 4.0 - 10.5 K/uL   RBC 5.91 (H) 4.22 - 5.81 MIL/uL   Hemoglobin 18.1 (H) 13.0 - 17.0 g/dL   HCT 78.2 (H) 95.6 - 21.3 %   MCV 89.0 78.0 - 100.0 fL   MCH 30.6 26.0 - 34.0 pg   MCHC 34.4 30.0 - 36.0 g/dL   RDW 08.6 57.8 - 46.9 %   Platelets 215 150 - 400 K/uL   No results found.  MDM  Patient was clinically mildly dehydrated upon arrival. Plan encourage oral hydration. Avoid dairy. Imodium for diarrhea. Prescription Zofran. [facility] Diagnosis #1gastroenteritis #2 dehydration Final diagnoses:  None        Doug Sou, MD 03/01/15 1559

## 2016-10-25 IMAGING — DX DG ELBOW COMPLETE 3+V*L*
4 series · 4 of 4 positions shown · non-contrast
Comparison: None.

CLINICAL DATA: Left elbow pain, burning after carrying a case of
water this afternoon.

EXAM:
LEFT ELBOW - COMPLETE 3+ VIEW

[elbow ap]
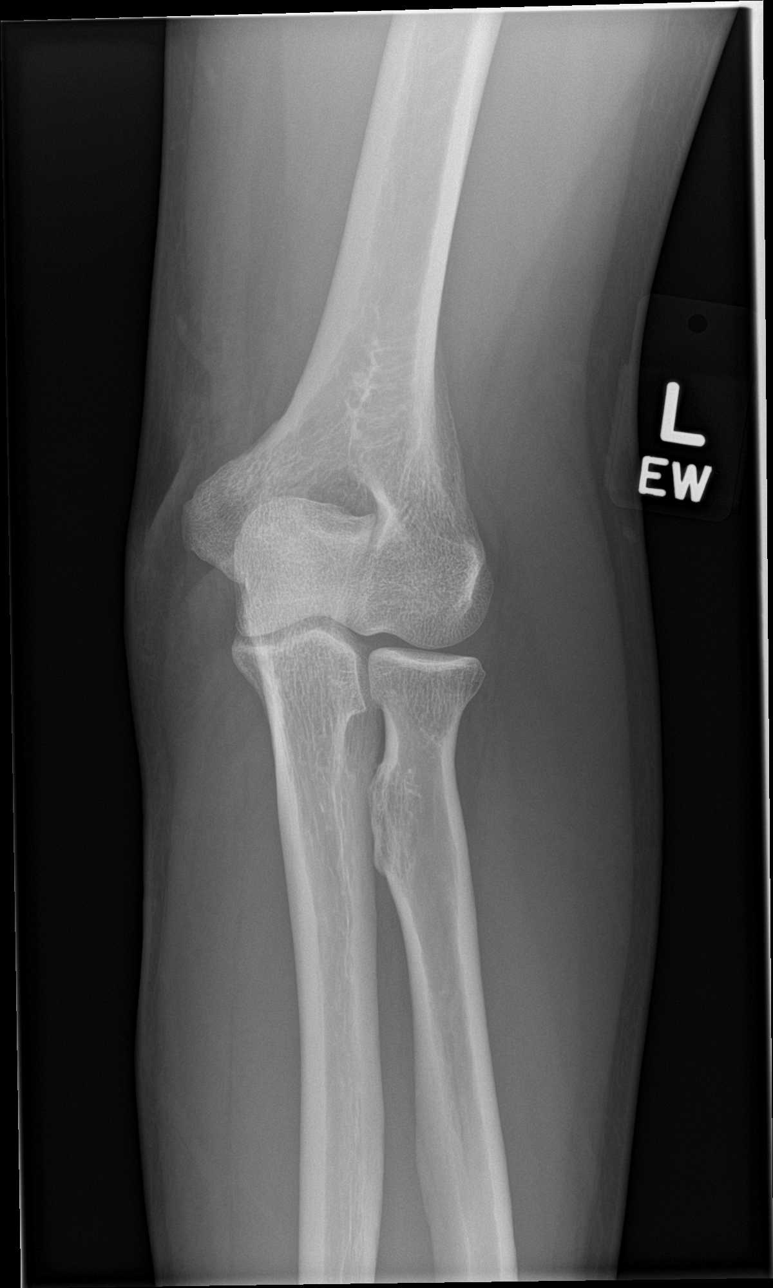

[elbow obl (1 of 2)]
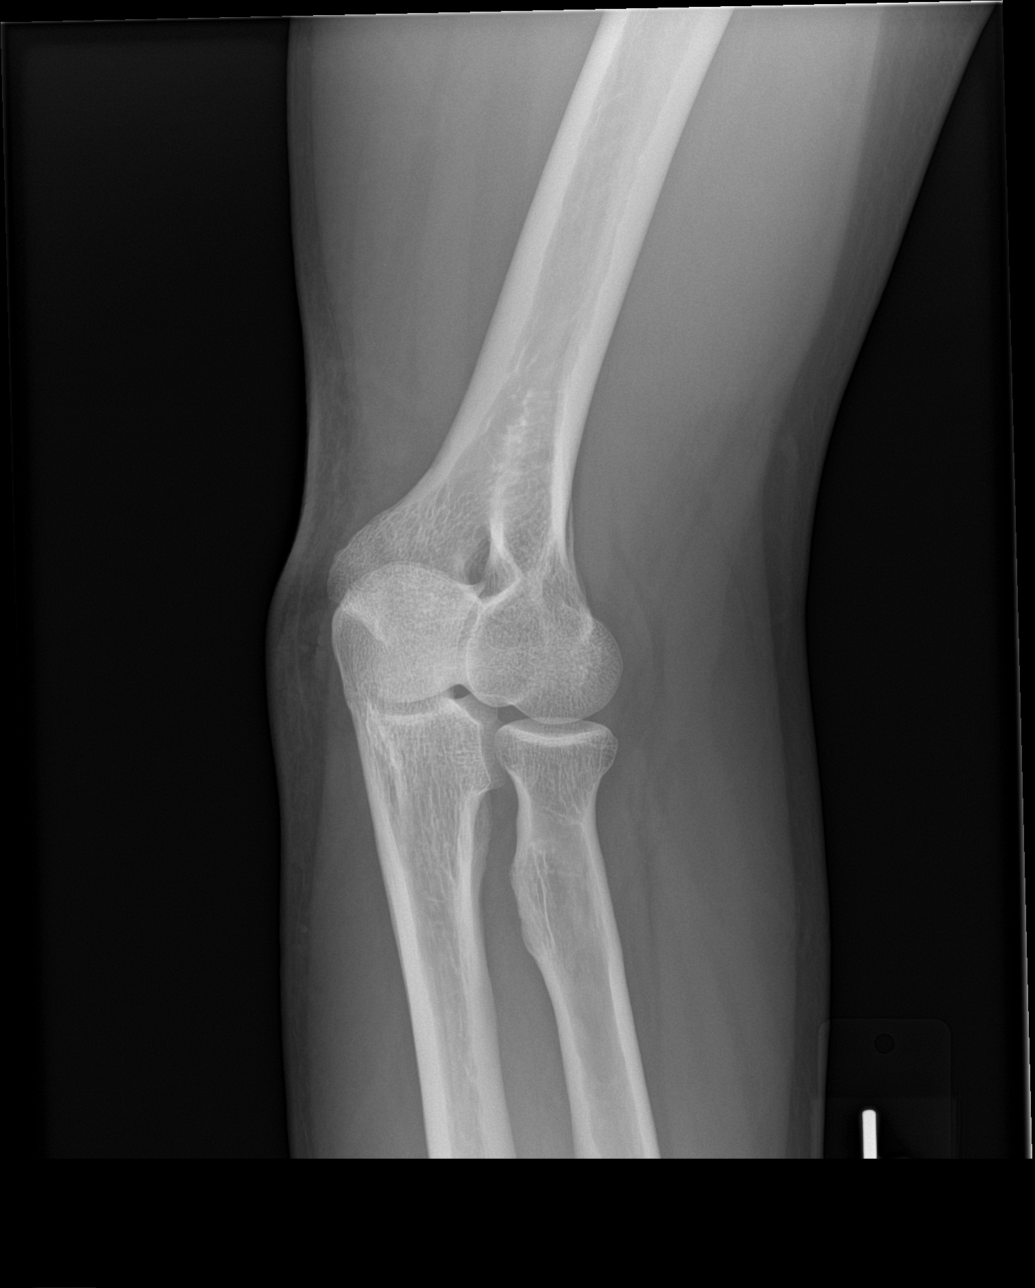

[elbow obl (2 of 2)]
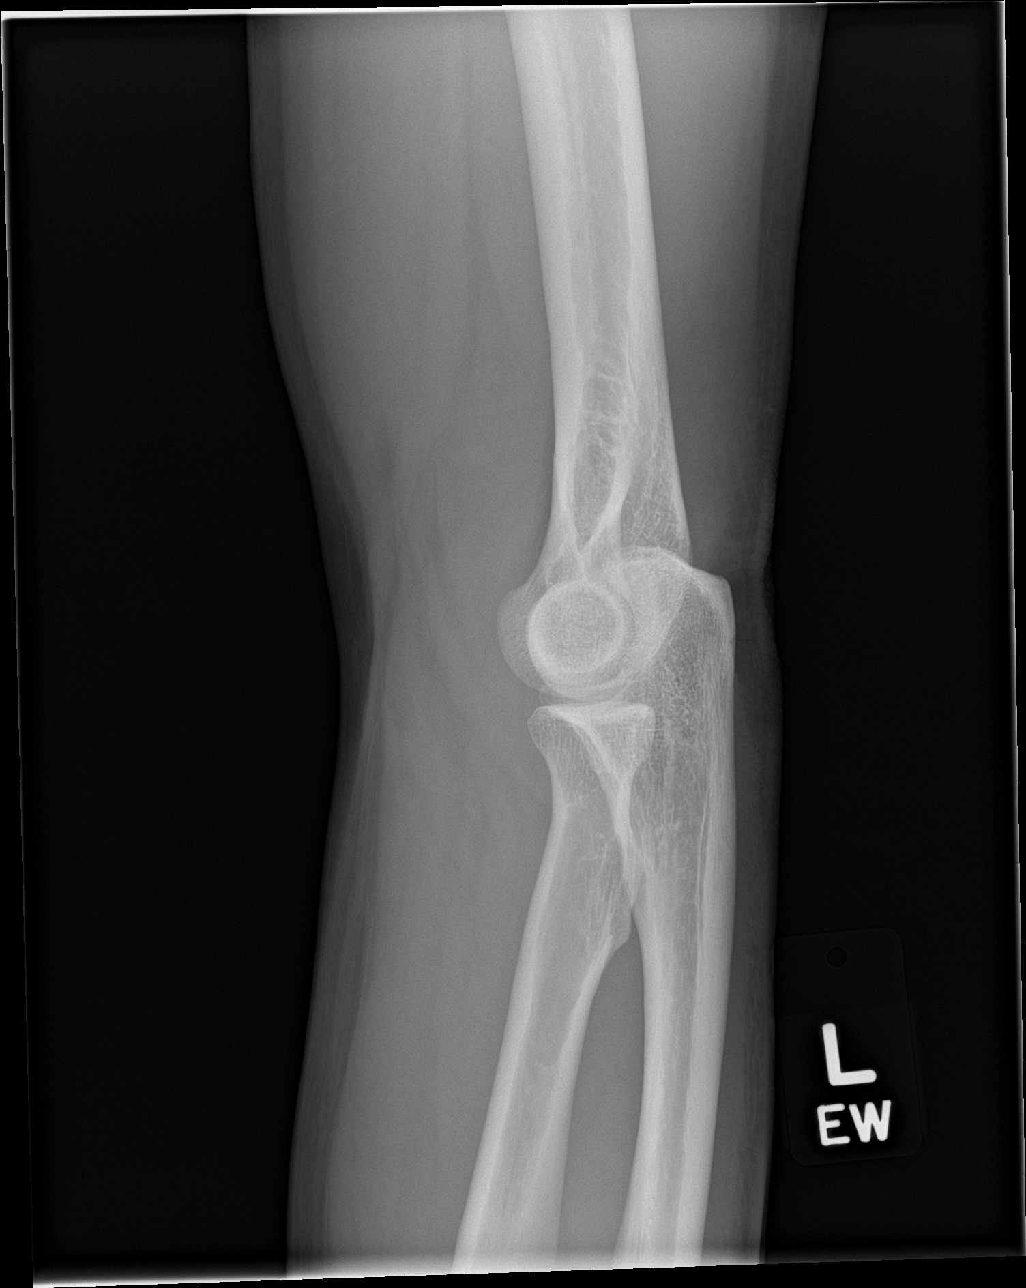

[elbow lat]
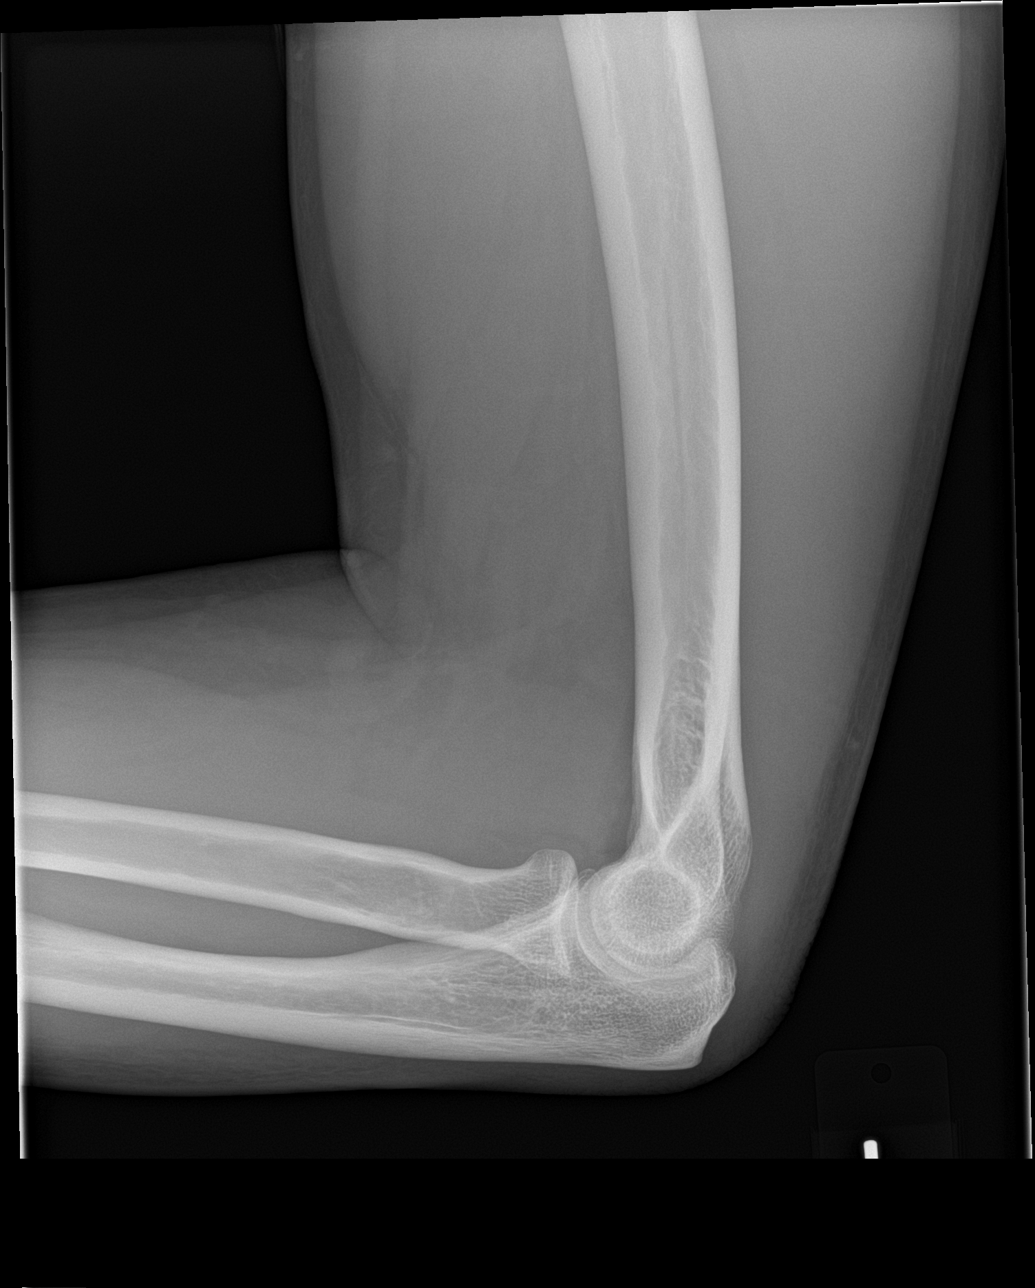

[4 of 4 positions shown; findings below may reference images not displayed]

FINDINGS: There is no evidence of fracture, dislocation, or joint effusion.
There is no evidence of arthropathy or other focal bone abnormality.
Soft tissues are unremarkable.
IMPRESSION: Negative.
